# Patient Record
Sex: Male | Born: 1980 | Race: White | Hispanic: No
Health system: Southern US, Community
[De-identification: ages and names within clinical notes are randomized; demographics above are authoritative.]

---

## 2006-10-20 ENCOUNTER — Emergency Department: Payer: Self-pay | Admitting: Emergency Medicine

## 2019-05-17 ENCOUNTER — Ambulatory Visit: Payer: Self-pay | Attending: Internal Medicine

## 2019-05-17 DIAGNOSIS — Z20822 Contact with and (suspected) exposure to covid-19: Secondary | ICD-10-CM

## 2019-05-17 DIAGNOSIS — U071 COVID-19: Secondary | ICD-10-CM | POA: Insufficient documentation

## 2019-05-18 LAB — NOVEL CORONAVIRUS, NAA: SARS-CoV-2, NAA: DETECTED — AB

## 2019-05-24 ENCOUNTER — Ambulatory Visit: Payer: Managed Care, Other (non HMO) | Attending: Internal Medicine

## 2019-05-24 DIAGNOSIS — U071 COVID-19: Secondary | ICD-10-CM | POA: Insufficient documentation

## 2019-05-24 DIAGNOSIS — Z20822 Contact with and (suspected) exposure to covid-19: Secondary | ICD-10-CM

## 2019-05-25 LAB — NOVEL CORONAVIRUS, NAA: SARS-CoV-2, NAA: DETECTED — AB

## 2019-06-26 ENCOUNTER — Ambulatory Visit: Payer: Managed Care, Other (non HMO) | Attending: Internal Medicine

## 2019-06-26 DIAGNOSIS — Z23 Encounter for immunization: Secondary | ICD-10-CM

## 2019-06-26 NOTE — Progress Notes (Signed)
   Covid-19 Vaccination Clinic  Name:  Mike Beasley    MRN: [id number] DOB: [birth year]  06/26/2019  [name] was observed post Covid-19 immunization for 15 minutes without incident. He was provided with Vaccine Information Sheet and instruction to access the V-Safe system.   [name] was instructed to call 911 with any severe reactions post vaccine: Marland Kitchen Difficulty breathing  . Swelling of face and throat  . A fast heartbeat  . A bad rash all over body  . Dizziness and weakness   Immunizations Administered    Name Date Dose VIS Date Route   Pfizer COVID-19 Vaccine 06/26/2019  9:59 AM 0.3 mL 03/19/2019 Intramuscular   Manufacturer: [employer], [employer]   Lot: [id number]   NDC: [address]-9

## 2019-07-27 ENCOUNTER — Ambulatory Visit: Payer: Managed Care, Other (non HMO)

## 2019-08-10 ENCOUNTER — Ambulatory Visit: Payer: Managed Care, Other (non HMO)

## 2019-08-17 ENCOUNTER — Ambulatory Visit: Payer: Managed Care, Other (non HMO)

## 2021-08-30 ENCOUNTER — Other Ambulatory Visit: Payer: Self-pay

## 2021-08-30 ENCOUNTER — Emergency Department
Admission: EM | Admit: 2021-08-30 | Discharge: 2021-08-30 | Disposition: A | Discharge status: Home / Self Care | Payer: Managed Care, Other (non HMO) | Attending: Emergency Medicine | Admitting: Emergency Medicine

## 2021-08-30 ENCOUNTER — Emergency Department: Payer: Managed Care, Other (non HMO)

## 2021-08-30 ENCOUNTER — Encounter: Payer: Self-pay | Admitting: Intensive Care

## 2021-08-30 DIAGNOSIS — R112 Nausea with vomiting, unspecified: Secondary | ICD-10-CM | POA: Diagnosis present

## 2021-08-30 DIAGNOSIS — K529 Noninfective gastroenteritis and colitis, unspecified: Secondary | ICD-10-CM | POA: Insufficient documentation

## 2021-08-30 DIAGNOSIS — D72829 Elevated white blood cell count, unspecified: Secondary | ICD-10-CM | POA: Insufficient documentation

## 2021-08-30 DIAGNOSIS — E860 Dehydration: Secondary | ICD-10-CM | POA: Diagnosis not present

## 2021-08-30 LAB — COMPREHENSIVE METABOLIC PANEL
ALT: 53 U/L — ABNORMAL HIGH (ref 0–44)
AST: 33 U/L (ref 15–41)
Albumin: 4.9 g/dL (ref 3.5–5.0)
Alkaline Phosphatase: 62 U/L (ref 38–126)
Anion gap: 10 (ref 5–15)
BUN: 17 mg/dL (ref 6–20)
CO2: 23 mmol/L (ref 22–32)
Calcium: 9.9 mg/dL (ref 8.9–10.3)
Chloride: 104 mmol/L (ref 98–111)
Creatinine, Ser: 1.13 mg/dL (ref 0.61–1.24)
GFR, Estimated: >60 mL/min (ref >60)
Glucose, Bld: 122 mg/dL — ABNORMAL HIGH (ref 70–99)
Potassium: 4.7 mmol/L (ref 3.5–5.1)
Sodium: 137 mmol/L (ref 135–145)
Total Bilirubin: 2.1 mg/dL — ABNORMAL HIGH (ref 0.3–1.2)
Total Protein: 8.3 g/dL — ABNORMAL HIGH (ref 6.5–8.1)

## 2021-08-30 LAB — URINALYSIS, ROUTINE W REFLEX MICROSCOPIC
Bilirubin Urine: NEGATIVE
Glucose, UA: NEGATIVE mg/dL
Hgb urine dipstick: NEGATIVE
Ketones, ur: 5 mg/dL — AB
Leukocytes,Ua: NEGATIVE
Nitrite: NEGATIVE
Protein, ur: NEGATIVE mg/dL
Specific Gravity, Urine: >1.046 — ABNORMAL HIGH (ref 1.005–1.030)
pH: 8 (ref 5.0–8.0)

## 2021-08-30 LAB — CBC
HCT: 56.7 % — ABNORMAL HIGH (ref 39.0–52.0)
Hemoglobin: 19.2 g/dL — ABNORMAL HIGH (ref 13.0–17.0)
MCH: 29.8 pg (ref 26.0–34.0)
MCHC: 33.9 g/dL (ref 30.0–36.0)
MCV: 87.9 fL (ref 80.0–100.0)
Platelets: 283 10*3/uL (ref 150–400)
RBC: 6.45 MIL/uL — ABNORMAL HIGH (ref 4.22–5.81)
RDW: 11.9 % (ref 11.5–15.5)
WBC: 13.6 10*3/uL — ABNORMAL HIGH (ref 4.0–10.5)
nRBC: 0 % (ref 0.0–0.2)

## 2021-08-30 LAB — LIPASE, BLOOD: Lipase: 33 U/L (ref 11–51)

## 2021-08-30 MED ORDER — LACTATED RINGERS IV BOLUS
1000.0000 mL | Freq: Once | INTRAVENOUS | Status: AC
  Administered 2021-08-30: 1000 mL via INTRAVENOUS

## 2021-08-30 MED ORDER — IOHEXOL 300 MG/ML  SOLN
100.0000 mL | Freq: Once | INTRAMUSCULAR | Status: AC | PRN
  Administered 2021-08-30: 100 mL via INTRAVENOUS

## 2021-08-30 MED ORDER — ONDANSETRON 4 MG PO TBDP: 4.0000 mg | ORAL_TABLET | Freq: Three times a day (TID) | ORAL | 0 refills | Status: AC | PRN

## 2021-08-30 MED ORDER — ONDANSETRON HCL 4 MG/2ML IJ SOLN
4.0000 mg | Freq: Once | INTRAMUSCULAR | Status: AC
  Administered 2021-08-30: 4 mg via INTRAVENOUS
  Filled 2021-08-30: qty 2

## 2021-08-30 MED ORDER — PROCHLORPERAZINE EDISYLATE 10 MG/2ML IJ SOLN
10.0000 mg | Freq: Once | INTRAMUSCULAR | Status: AC
  Administered 2021-08-30: 10 mg via INTRAVENOUS
  Filled 2021-08-30: qty 2

## 2021-08-30 NOTE — ED Triage Notes (Signed)
Patient c/o weakness and N/V/D X12 hours.

## 2021-08-30 NOTE — ED Provider Notes (Signed)
[facility] Provider Note    Event Date/Time   First MD Initiated Contact with Patient 08/30/21 1407     (approximate)   History   Chief Complaint Weakness and Emesis   HPI  Mike Beasley is a 41 y.o. male with no significant past medical history who presents to the ED complaining of nausea and vomiting.  Patient reports that overnight last night he developed nausea and has had numerous episodes of vomiting.  He states he has been unable to keep down either liquids or solids and now "hurts all over" from the multiple episodes of vomiting.  He reports associated diarrhea and chills, denies any fevers, cough, chest pain, shortness of breath, dysuria, hematuria, or flank pain.  He reports his wife has been sick with similar symptoms, they both had purchased a smoothie for lunch yesterday but ate different things for dinner.     Physical Exam   Triage Vital Signs: ED Triage Vitals  Enc Vitals Group     BP 08/30/21 1357 (!) 136/95     Pulse Rate 08/30/21 1357 (!) 137     Resp 08/30/21 1357 20     Temp 08/30/21 1357 99.3 F (37.4 C)     Temp Source 08/30/21 1357 Oral     SpO2 08/30/21 1357 99 %     Weight 08/30/21 1352 188 lb (85.3 kg)     Height 08/30/21 1352 5\' 9"  (1.753 m)     Head Circumference --      Peak Flow --      Pain Score 08/30/21 1352 8     Pain Loc --      Pain Edu? --      Excl. in [place]? --     Most recent vital signs: Vitals:   08/30/21 1612 08/30/21 1708  BP:  130/80  Pulse:  (!) 122  Resp: 19 18  Temp:    SpO2:  99%    Constitutional: Alert and oriented. Eyes: Conjunctivae are normal. Head: Atraumatic. Nose: No congestion/rhinnorhea. Mouth/Throat: Mucous membranes are moist. Cardiovascular: Tachycardic, regular rhythm. Grossly normal heart sounds.  2+ radial pulses bilaterally. Respiratory: Normal respiratory effort.  No retractions. Lungs CTAB. Gastrointestinal: Soft and nontender. No distention. Musculoskeletal:  No lower extremity tenderness nor edema.  Neurologic:  Normal speech and language. No gross focal neurologic deficits are appreciated.    ED Results / Procedures / Treatments   Labs (all labs ordered are listed, but only abnormal results are displayed) Labs Reviewed  COMPREHENSIVE METABOLIC PANEL - Abnormal; Notable for the following components:      Result Value   Glucose, Bld 122 (*)    Total Protein 8.3 (*)    ALT 53 (*)    Total Bilirubin 2.1 (*)    All other components within normal limits  CBC - Abnormal; Notable for the following components:   WBC 13.6 (*)    RBC 6.45 (*)    Hemoglobin 19.2 (*)    HCT 56.7 (*)    All other components within normal limits  URINALYSIS, ROUTINE W REFLEX MICROSCOPIC - Abnormal; Notable for the following components:   Color, Urine YELLOW (*)    APPearance CLEAR (*)    Specific Gravity, Urine >1.046 (*)    Ketones, ur 5 (*)    All other components within normal limits  LIPASE, BLOOD     EKG  ED ECG REPORT I, Blake Divine, the attending physician, personally viewed and interpreted this ECG.   Date:  08/30/2021  EKG Time: 14:01  Rate: 136  Rhythm: sinus tachycardia  Axis: Normal  Intervals:none  ST&T Change: None  RADIOLOGY CT of abdomen/pelvis reviewed and interpreted by me with no inflammatory changes, focal fluid collections, or dilated bowel loops.  PROCEDURES:  Critical Care performed: No  Procedures   MEDICATIONS ORDERED IN ED: Medications  ondansetron (ZOFRAN) injection 4 mg (4 mg Intravenous Given 08/30/21 1434)  lactated ringers bolus 1,000 mL (0 mLs Intravenous Stopped 08/30/21 1545)  lactated ringers bolus 1,000 mL (1,000 mLs Intravenous New Bag/Given 08/30/21 1636)  prochlorperazine (COMPAZINE) injection 10 mg (10 mg Intravenous Given 08/30/21 1636)  iohexol (OMNIPAQUE) 300 MG/ML solution 100 mL (100 mLs Intravenous Contrast Given 08/30/21 1641)     IMPRESSION / MDM / ASSESSMENT AND PLAN / ED COURSE  I  reviewed the triage vital signs and the nursing notes.                              41 y.o. male with no significant past medical history who presents to the ED complaining of nausea, vomiting, and diarrhea developing overnight to the point that he has been unable to keep anything down, complains of diffuse pain from multiple episodes of vomiting.  Differential diagnosis includes, but is not limited to, gastroenteritis, colitis, inflammatory bowel disease, UTI, kidney stone, appendicitis, dehydration, electrolyte abnormality.  Patient well-appearing and in no acute distress, vital signs remarkable for tachycardia, likely due to frequent vomiting and dehydration.  EKG shows sinus tachycardia with no ischemic changes, labs are pending at this time.  He has a benign abdominal exam and I do not feel imaging is indicated given symptoms likely secondary to gastroenteritis.  We will hydrate with IV fluid and treat symptomatically with IV Zofran, reassess.  Labs overall reassuring with BMP showing no AKI or electrolyte abnormality, patient does have mild elevation in bilirubin but no transaminitis to suggest hepatitis.  CBC shows elevated hemoglobin along with leukocytosis, most consistent with hemoconcentration as urinalysis also appears consistent with dehydration.  No signs of urinary tract infection.  Patient does report ongoing nausea and remains tachycardic despite IV fluids and Zofran.  CT scan was performed and remarkable only for mild colitis, patient subsequently given additional IV fluids with dose of Compazine.  Nausea is now much improved and he is tolerating water without difficulty, wishes to hold off on solid foods.  Tachycardia remains present but gradually improving and patient is requesting to be discharged home.  He was counseled to follow-up with his PCP and to return to the ED for new or worsening symptoms, patient agrees with plan.      FINAL CLINICAL IMPRESSION(S) / ED DIAGNOSES    Final diagnoses:  Gastroenteritis  Dehydration     Rx / DC Orders   ED Discharge Orders          Ordered    ondansetron (ZOFRAN-ODT) 4 MG disintegrating tablet  Every 8 hours PRN        08/30/21 1809             Note:  This document was prepared using Dragon voice recognition software and may include unintentional dictation errors.   Blake Divine, MD 08/30/21 [phone]

## 2021-08-30 NOTE — ED Notes (Addendum)
Pt asleep when this RN entered room; pt easily woken by voice; pt reports very mild nausea; HR remains elevated; provider [place] notified; visitor remains with pt.

## 2021-08-30 NOTE — ED Notes (Signed)
Pt given warm blankets. Visitor remains at beside.

## 2022-03-25 ENCOUNTER — Ambulatory Visit
Admission: RE | Admit: 2022-03-25 | Discharge: 2022-03-25 | Disposition: A | Discharge status: Home / Self Care | Payer: No Typology Code available for payment source | Source: Ambulatory Visit | Attending: Family | Admitting: Family

## 2022-03-25 ENCOUNTER — Ambulatory Visit
Admission: RE | Admit: 2022-03-25 | Discharge: 2022-03-25 | Disposition: A | Discharge status: Home / Self Care | Payer: No Typology Code available for payment source | Attending: Family | Admitting: Family

## 2022-03-25 ENCOUNTER — Other Ambulatory Visit: Payer: Self-pay | Admitting: Family

## 2022-03-25 DIAGNOSIS — S8011XA Contusion of right lower leg, initial encounter: Secondary | ICD-10-CM

## 2023-02-07 IMAGING — CT CT ABD-PELV W/ CM
2 of 5 series · 16 of 46 positions shown, 18 images · IV contrast (APPLIED)
Comparison: None Available.

CLINICAL DATA: Abdominal pain, nausea, vomiting, diarrhea

EXAM:
CT ABDOMEN AND PELVIS WITH CONTRAST
TECHNIQUE: Multidetector CT imaging of the abdomen and pelvis was performed
using the standard protocol following bolus administration of
intravenous contrast.

[Series 2: abdomen 5.0 · axial · 0.75mm/px · z∈[-1217,-777]mm · 13 of 102 slices shown, 15 images]
[im 7/102  soft-tissue]
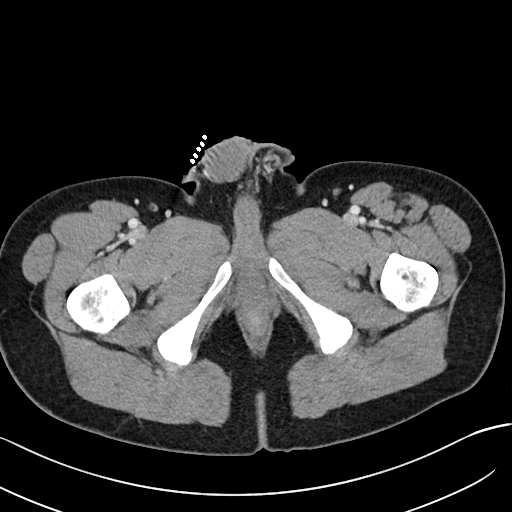
[im 7/102  bone]
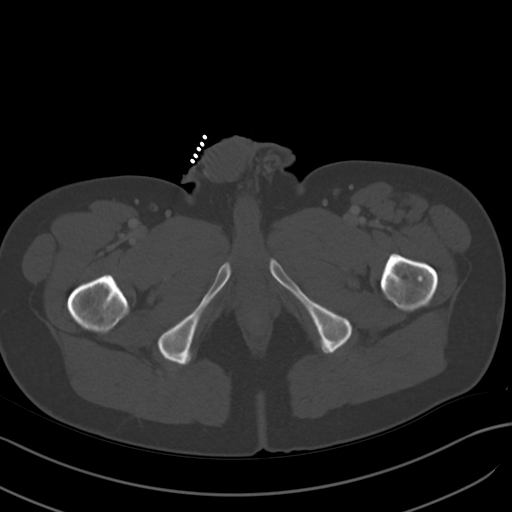
[im 14/102  soft-tissue]
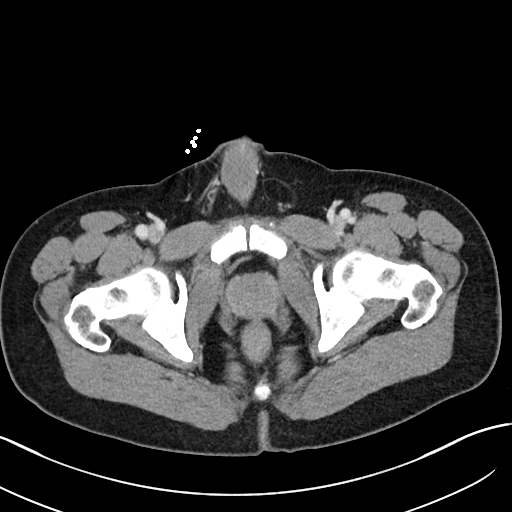
[im 21/102  soft-tissue]
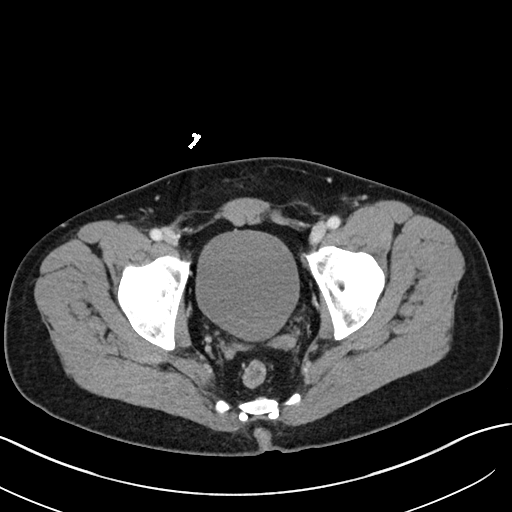
[im 27/102  soft-tissue]
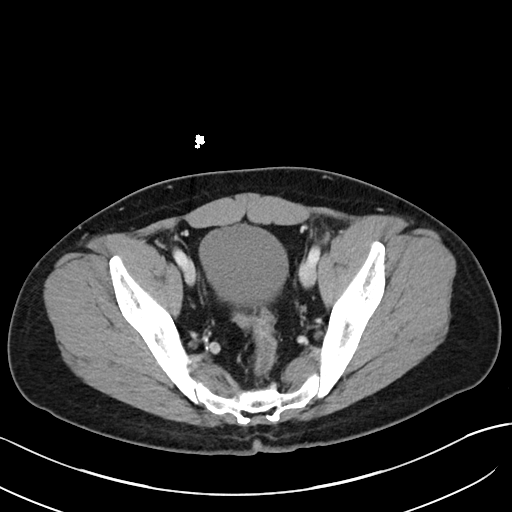
[im 34/102  soft-tissue]
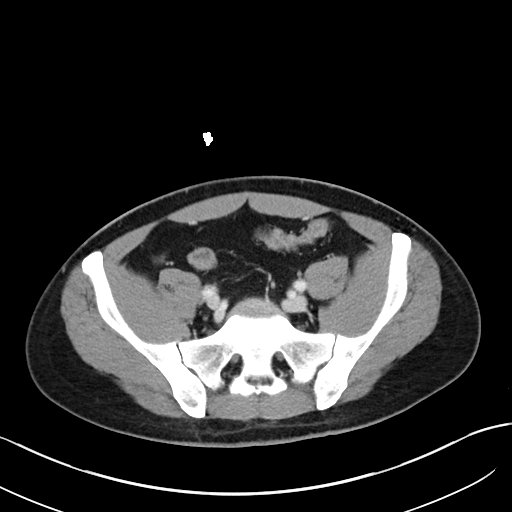
[im 41/102  soft-tissue]
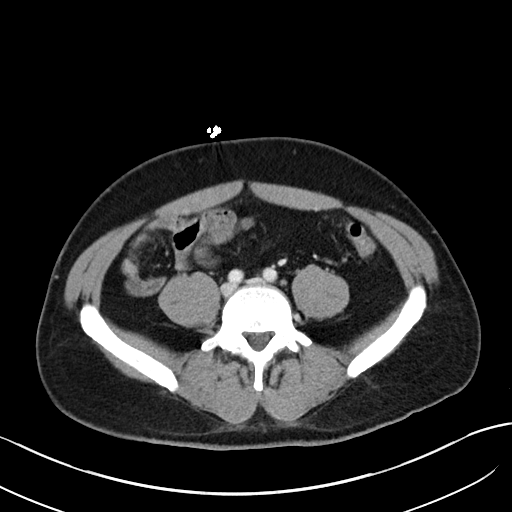
[im 54/102  soft-tissue]
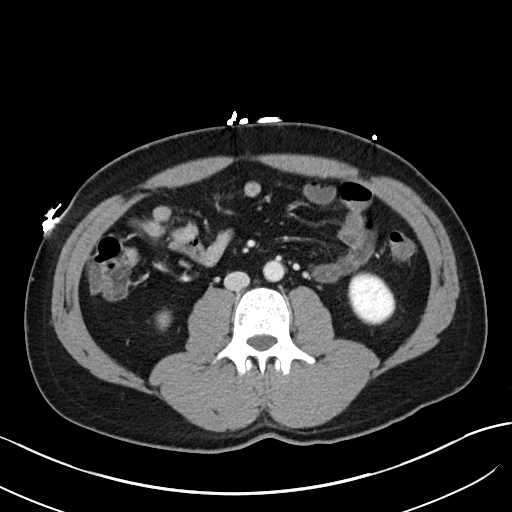
[im 61/102  soft-tissue]
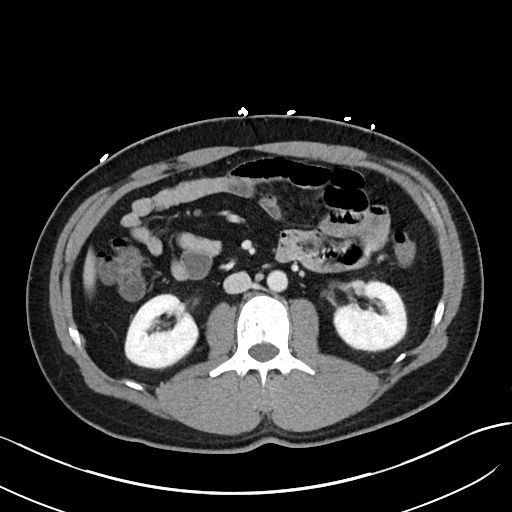
[im 68/102  soft-tissue]
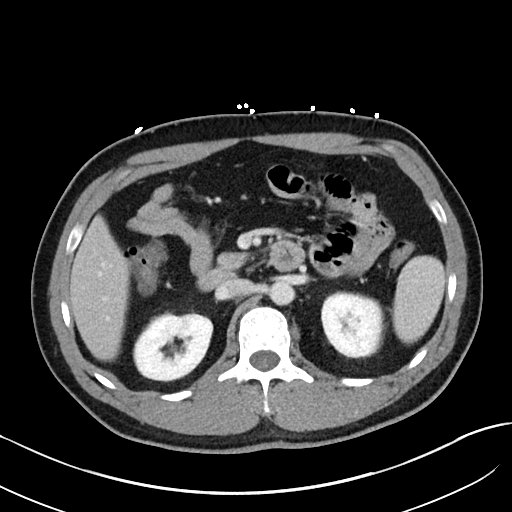
[im 68/102  bone]
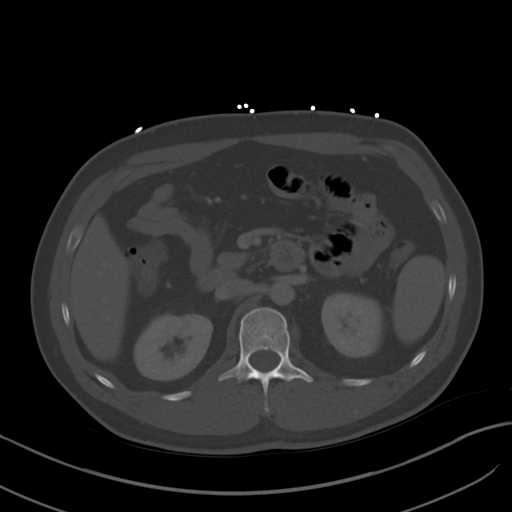
[im 75/102  soft-tissue]
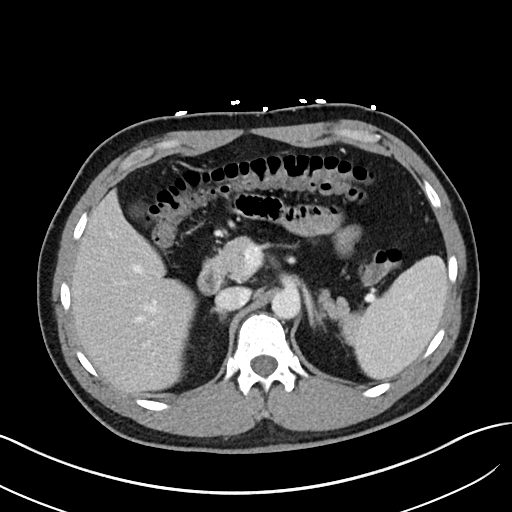
[im 81/102  soft-tissue]
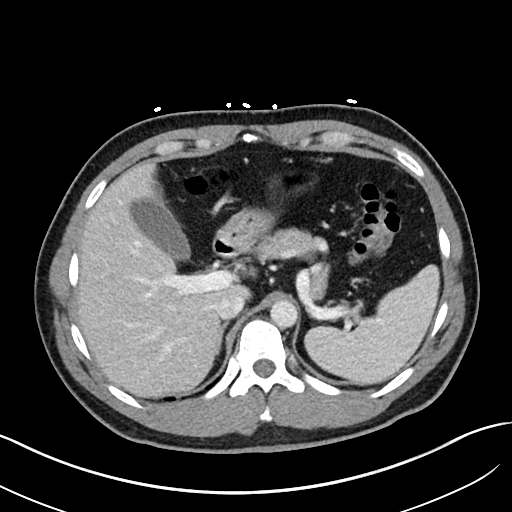
[im 88/102  soft-tissue]
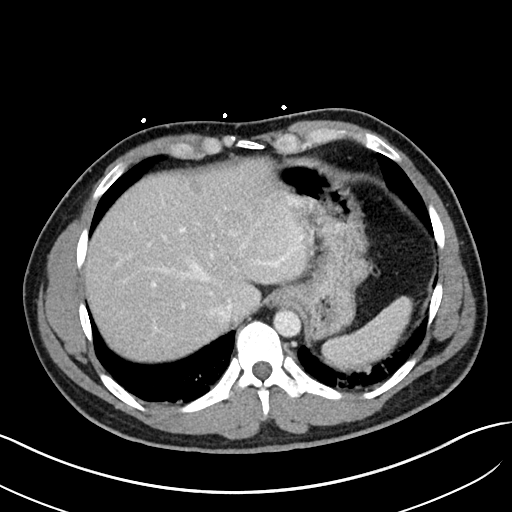
[im 95/102  soft-tissue]
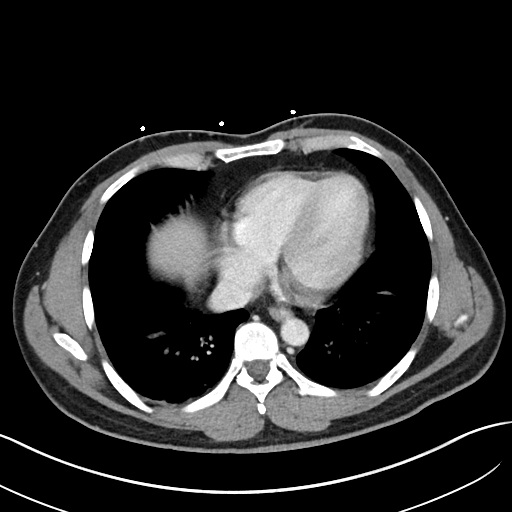

[Series 5: abdomen 3.0 mpr cor · coronal · 0.78mm/px · 3 of 84 slices shown]
[im 28/84  soft-tissue]
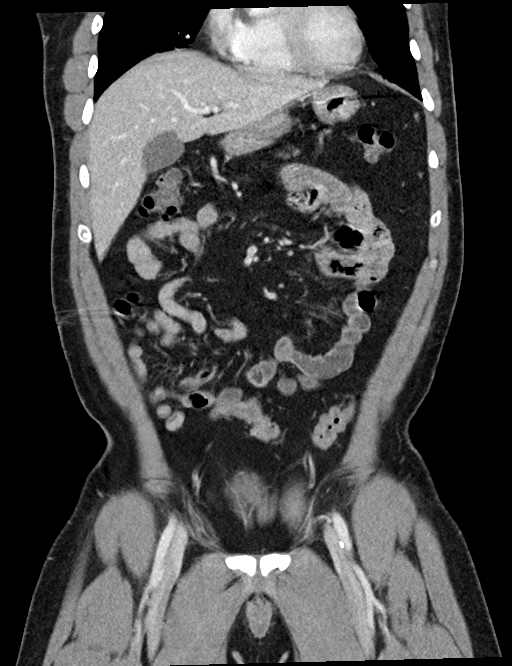
[im 37/84  soft-tissue]
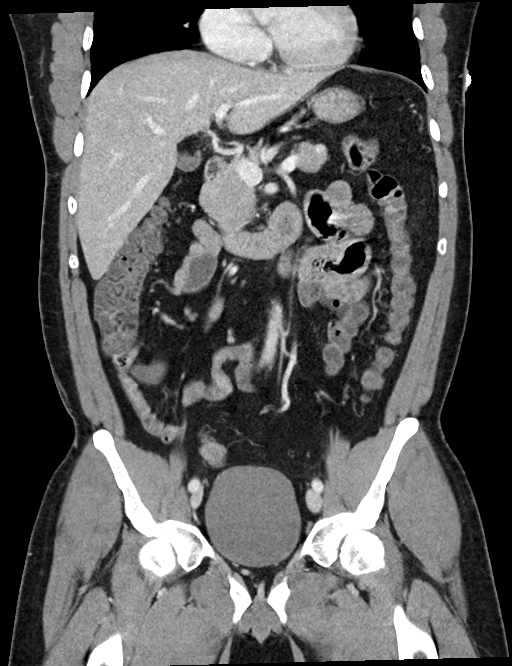
[im 47/84  soft-tissue]
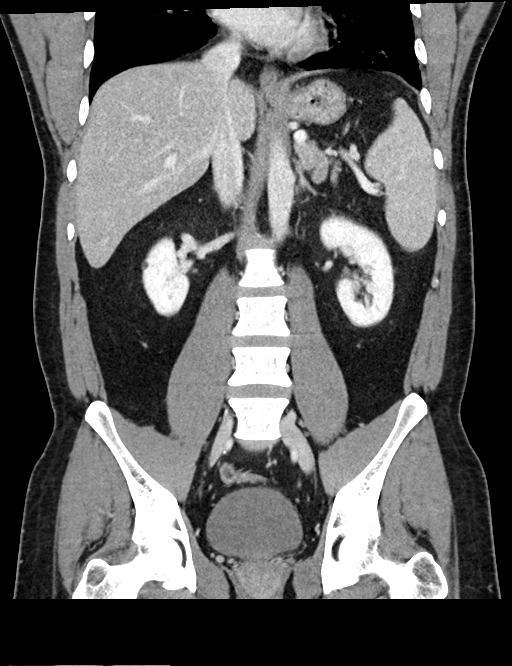

[16 of 46 positions shown; findings below may reference images not displayed]

RADIATION DOSE REDUCTION: This exam was performed according to the
departmental dose-optimization program which includes automated
exposure control, adjustment of the mA and/or kV according to
patient size and/or use of iterative reconstruction technique.

CONTRAST:  100mL OMNIPAQUE IOHEXOL 300 MG/ML  SOLN
FINDINGS: Lower chest: There are linear densities in the lower lung fields
suggesting subsegmental atelectasis. Evaluation of lower lung fields
is limited by motion artifacts.

Hepatobiliary: Liver measures 18 cm in length. There is 7 mm
low-density in the right lobe of liver. There is no dilation of bile
ducts. Gallbladder is unremarkable.

Pancreas: No focal abnormality is seen.

Spleen: Spleen measures 13.1 cm.

Adrenals/Urinary Tract: Adrenals are not enlarged. There is no
hydronephrosis. There are renal or ureteral stones. There is 3 mm
low-density lesion in the midportion of right kidney, possibly a
tiny cyst. Urinary bladder is unremarkable.

Stomach/Bowel: Stomach is unremarkable. Small bowel loops are not
dilated. Appendix is not dilated. There is mild diffuse wall
thickening in the colon. There is incomplete distention of left
colon. There is no pericolic stranding or fluid collection.

Vascular/Lymphatic: Unremarkable.

Reproductive: Unremarkable.

Other: There is no ascites pneumoperitoneum. Small umbilical hernia
containing fat is seen. Bilateral inguinal hernias containing fat
are noted.

Musculoskeletal: Unremarkable.
IMPRESSION: There is no evidence of intestinal obstruction or pneumoperitoneum.
There is no hydronephrosis. Appendix is not dilated.

There is mild wall thickening in the colon which may be due to
incomplete distention or suggest mild colitis.

Small linear densities in the lower lung fields suggest subsegmental
atelectasis.

Other findings as described in the body of the report.

## 2023-08-26 ENCOUNTER — Other Ambulatory Visit: Payer: Self-pay | Admitting: Family Medicine

## 2023-08-26 DIAGNOSIS — N5082 Scrotal pain: Secondary | ICD-10-CM

## 2023-08-28 ENCOUNTER — Ambulatory Visit
Admission: RE | Admit: 2023-08-28 | Discharge: 2023-08-28 | Disposition: A | Discharge status: Home / Self Care | Source: Ambulatory Visit | Attending: Family Medicine | Admitting: Family Medicine

## 2023-08-28 DIAGNOSIS — N5082 Scrotal pain: Secondary | ICD-10-CM | POA: Diagnosis present

## 2023-12-23 ENCOUNTER — Other Ambulatory Visit: Payer: Self-pay | Admitting: Family Medicine

## 2023-12-23 DIAGNOSIS — R1032 Left lower quadrant pain: Secondary | ICD-10-CM

## 2023-12-23 DIAGNOSIS — K4020 Bilateral inguinal hernia, without obstruction or gangrene, not specified as recurrent: Secondary | ICD-10-CM

## 2023-12-23 DIAGNOSIS — R1030 Lower abdominal pain, unspecified: Secondary | ICD-10-CM

## 2023-12-29 ENCOUNTER — Ambulatory Visit

## 2024-01-02 ENCOUNTER — Ambulatory Visit

## 2024-01-29 ENCOUNTER — Inpatient Hospital Stay: Attending: Family Medicine

## 2024-01-29 NOTE — Progress Notes (Signed)
 [facility] Advance Directives Clinical Social Work  Patient presented to Advance Directives Clinic  to review and complete healthcare advance directives.  Clinical Social Worker met with Cully and his wife, Camie.  Matt  designated Camie Hruslinksi as their primary healthcare agent.  Adina also completed healthcare living will.    Documents were notarized and copies made for patient/family. Clinical Social Worker will send documents to medical records to be scanned into chart. Clinical Social Worker encouraged family to contact with any additional questions or concerns.   Macario CHRISTELLA Au, LCSW Clinical Social Worker [facility]
